# Patient Record
Sex: Female | Born: 1995 | Race: Black or African American | Hispanic: No | Marital: Single | Smoking: Never smoker
Health system: Southern US, Community
[De-identification: ages and names within clinical notes are randomized; demographics above are authoritative.]

## PROBLEM LIST (undated history)

## (undated) DIAGNOSIS — J45909 Unspecified asthma, uncomplicated: Secondary | ICD-10-CM

---
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
## 2017-04-25 DIAGNOSIS — O09299 Supervision of pregnancy with other poor reproductive or obstetric history, unspecified trimester: Secondary | ICD-10-CM | POA: Insufficient documentation

## 2018-04-12 DIAGNOSIS — Z98891 History of uterine scar from previous surgery: Secondary | ICD-10-CM | POA: Insufficient documentation

## 2019-01-31 DIAGNOSIS — R87610 Atypical squamous cells of undetermined significance on cytologic smear of cervix (ASC-US): Secondary | ICD-10-CM | POA: Insufficient documentation

## 2020-01-28 ENCOUNTER — Ambulatory Visit: Payer: Self-pay

## 2020-02-16 ENCOUNTER — Ambulatory Visit
Admission: EM | Admit: 2020-02-16 | Discharge: 2020-02-16 | Disposition: A | Discharge status: Home / Self Care | Payer: Medicaid Other | Attending: Emergency Medicine | Admitting: Emergency Medicine

## 2020-02-16 ENCOUNTER — Other Ambulatory Visit: Payer: Self-pay

## 2020-02-16 DIAGNOSIS — R112 Nausea with vomiting, unspecified: Secondary | ICD-10-CM

## 2020-02-16 DIAGNOSIS — R1013 Epigastric pain: Secondary | ICD-10-CM | POA: Insufficient documentation

## 2020-02-16 DIAGNOSIS — K5900 Constipation, unspecified: Secondary | ICD-10-CM | POA: Diagnosis not present

## 2020-02-16 DIAGNOSIS — Z3490 Encounter for supervision of normal pregnancy, unspecified, unspecified trimester: Secondary | ICD-10-CM | POA: Insufficient documentation

## 2020-02-16 LAB — POCT URINALYSIS DIP (MANUAL ENTRY)
Blood, UA: NEGATIVE
Glucose, UA: NEGATIVE mg/dL
Leukocytes, UA: NEGATIVE
Nitrite, UA: NEGATIVE
Protein Ur, POC: 30 mg/dL — AB
Spec Grav, UA: >=1.03 — AB (ref 1.010–1.025)
Urobilinogen, UA: 0.2 E.U./dL
pH, UA: 6.5 (ref 5.0–8.0)

## 2020-02-16 LAB — POCT URINE PREGNANCY: Preg Test, Ur: POSITIVE — AB

## 2020-02-16 NOTE — ED Provider Notes (Signed)
Patricia Shepard    CSN: [id number] Arrival date & time: 02/16/20  0909      History   Chief Complaint Chief Complaint  Patient presents with  . Constipation    HPI Patricia Shepard is a 24 y.o. female.   Patient presents with 3-day history of abdominal cramping, constipation, nausea, vomiting.  No emesis today; one episode of emesis yesterday.  Patient had a bowel movement yesterday but felt like it was incomplete.  She reports decreased appetite.  She states she had vaginal discharge last week but not now.  She denies fever, chills, dysuria, back pain, pelvic pain, or other symptoms.  Treatment attempted at home with MiraLAX and prune juice.  The history is provided by the patient.    Past Medical History:  Diagnosis Date  . Asthma     There are no problems to display for this patient.   Past Surgical History:  Procedure Laterality Date  . CESAREAN SECTION      OB History   No obstetric history on file.      Home Medications    Prior to Admission medications   Medication Sig Start Date End Date Taking? Authorizing Provider  albuterol (VENTOLIN HFA) 108 (90 Base) MCG/ACT inhaler Inhale into the lungs. 07/16/18  Yes [provider]    Family History History reviewed. No pertinent family history.  Social History Social History   Tobacco Use  . Smoking status: Never Smoker  . Smokeless tobacco: Never Used  Substance Use Topics  . Alcohol use: Never  . Drug use: Never     Allergies   Patient has no allergy information on record.   Review of Systems Review of Systems  Constitutional: Negative for chills and fever.  HENT: Negative for ear pain and sore throat.   Eyes: Negative for pain and visual disturbance.  Respiratory: Negative for cough and shortness of breath.   Cardiovascular: Negative for chest pain and palpitations.  Gastrointestinal: Positive for abdominal pain, constipation, nausea and vomiting. Negative for diarrhea.    Genitourinary: Negative for dysuria and hematuria.  Musculoskeletal: Negative for arthralgias and back pain.  Skin: Negative for color change and rash.  Neurological: Negative for seizures and syncope.  All other systems reviewed and are negative.    Physical Exam Triage Vital Signs ED Triage Vitals  Enc Vitals Group     BP      Pulse      Resp      Temp      Temp src      SpO2      Weight      Height      Head Circumference      Peak Flow      Pain Score      Pain Loc      Pain Edu?      Excl. in GC?    No data found.  Updated Vital Signs BP 100/61   Pulse 90   Temp 98.4 F (36.9 C) (Oral)   Resp 16   LMP  (LMP Unknown)   SpO2 98%   Visual Acuity Right Eye Distance:   Left Eye Distance:   Bilateral Distance:    Right Eye Near:   Left Eye Near:    Bilateral Near:     Physical Exam Vitals and nursing note reviewed.  Constitutional:      General: She is not in acute distress.    Appearance: She is well-developed. She is not ill-appearing.  HENT:     Head: Normocephalic and atraumatic.     Mouth/Throat:     Mouth: Mucous membranes are moist.  Eyes:     Conjunctiva/sclera: Conjunctivae normal.  Cardiovascular:     Rate and Rhythm: Normal rate and regular rhythm.     Heart sounds: No murmur heard.   Pulmonary:     Effort: Pulmonary effort is normal. No respiratory distress.     Breath sounds: Normal breath sounds.  Abdominal:     General: Bowel sounds are normal.     Palpations: Abdomen is soft.     Tenderness: There is abdominal tenderness in the epigastric area. There is no right CVA tenderness, left CVA tenderness, guarding or rebound.  Musculoskeletal:     Cervical back: Neck supple.  Skin:    General: Skin is warm and dry.     Findings: No rash.  Neurological:     General: No focal deficit present.     Mental Status: She is alert and oriented to person, place, and time.     Gait: Gait normal.  Psychiatric:        Mood and Affect: Mood  normal.        Behavior: Behavior normal.      UC Treatments / Results  Labs (all labs ordered are listed, but only abnormal results are displayed) Labs Reviewed  POCT URINALYSIS DIP (MANUAL ENTRY) - Abnormal; Notable for the following components:      Result Value   Clarity, UA hazy (*)    Bilirubin, UA small (*)    Ketones, POC UA moderate (40) (*)    Spec Grav, UA >=1.030 (*)    Protein Ur, POC =30 (*)    All other components within normal limits  POCT URINE PREGNANCY - Abnormal; Notable for the following components:   Preg Test, Ur Positive (*)    All other components within normal limits  CERVICOVAGINAL ANCILLARY ONLY    EKG   Radiology No results found.  Procedures Procedures (including critical care time)  Medications Ordered in UC Medications - No data to display  Initial Impression / Assessment and Plan / UC Course  I have reviewed the triage vital signs and the nursing notes.  Pertinent labs & imaging results that were available during my care of the patient were reviewed by me and considered in my medical decision making (see chart for details).   Pregnant, Epigastric pain, nausea with vomiting, constipation.  Urine pregnancy positive.  Instructed patient to go to the [facility] for evaluation of her abdominal pain in the setting of newly diagnosed pregnancy.  Patient agrees to plan of care.   Final Clinical Impressions(s) / UC Diagnoses   Final diagnoses:  Pregnancy, unspecified gestational age  Epigastric pain  Non-intractable vomiting with nausea, unspecified vomiting type  Constipation, unspecified constipation type     Discharge Instructions     Go to [facility] hospital for evaluation of your abdominal pain and pregnancy.        ED Prescriptions    None     PDMP not reviewed this encounter.   Mickie Bail, NP 02/16/20 1017

## 2020-02-16 NOTE — ED Triage Notes (Signed)
Pt reports feeling constipated x3 days. sts last BM was yesterday but it was only a "little bit". sts she has taken miralax, laxative and drank some prune juice.  sts she has had 3 episodes of emesis within the last 3 days. sts she is unable to eat.

## 2020-02-16 NOTE — Discharge Instructions (Addendum)
Go to [facility] hospital for evaluation of your abdominal pain and pregnancy.

## 2020-02-17 ENCOUNTER — Encounter (HOSPITAL_COMMUNITY): Payer: Self-pay | Admitting: Obstetrics & Gynecology

## 2020-02-17 ENCOUNTER — Other Ambulatory Visit: Payer: Self-pay

## 2020-02-17 ENCOUNTER — Telehealth (HOSPITAL_COMMUNITY): Payer: Self-pay | Admitting: Emergency Medicine

## 2020-02-17 ENCOUNTER — Inpatient Hospital Stay (HOSPITAL_COMMUNITY)
Admission: AD | Admit: 2020-02-17 | Discharge: 2020-02-17 | Disposition: A | Discharge status: Home / Self Care | Payer: Medicaid Other | Attending: Obstetrics & Gynecology | Admitting: Obstetrics & Gynecology

## 2020-02-17 ENCOUNTER — Inpatient Hospital Stay (HOSPITAL_COMMUNITY): Payer: Medicaid Other

## 2020-02-17 DIAGNOSIS — Z3A12 12 weeks gestation of pregnancy: Secondary | ICD-10-CM | POA: Insufficient documentation

## 2020-02-17 DIAGNOSIS — N760 Acute vaginitis: Secondary | ICD-10-CM

## 2020-02-17 DIAGNOSIS — R109 Unspecified abdominal pain: Secondary | ICD-10-CM | POA: Insufficient documentation

## 2020-02-17 DIAGNOSIS — Z3491 Encounter for supervision of normal pregnancy, unspecified, first trimester: Secondary | ICD-10-CM

## 2020-02-17 DIAGNOSIS — O26891 Other specified pregnancy related conditions, first trimester: Secondary | ICD-10-CM | POA: Diagnosis not present

## 2020-02-17 LAB — CERVICOVAGINAL ANCILLARY ONLY
Bacterial Vaginitis (gardnerella): POSITIVE — AB
Candida Glabrata: NEGATIVE
Candida Vaginitis: NEGATIVE
Chlamydia: NEGATIVE
Comment: NEGATIVE
Comment: NEGATIVE
Comment: NEGATIVE
Comment: NEGATIVE
Comment: NEGATIVE
Comment: NORMAL
Neisseria Gonorrhea: NEGATIVE
Trichomonas: NEGATIVE

## 2020-02-17 LAB — CBC
HCT: 42.8 % (ref 36.0–46.0)
Hemoglobin: 13.9 g/dL (ref 12.0–15.0)
MCH: 27.7 pg (ref 26.0–34.0)
MCHC: 32.5 g/dL (ref 30.0–36.0)
MCV: 85.3 fL (ref 80.0–100.0)
Platelets: 461 10*3/uL — ABNORMAL HIGH (ref 150–400)
RBC: 5.02 MIL/uL (ref 3.87–5.11)
RDW: 12.7 % (ref 11.5–15.5)
WBC: 7.8 10*3/uL (ref 4.0–10.5)
nRBC: 0 % (ref 0.0–0.2)

## 2020-02-17 LAB — HIV ANTIBODY (ROUTINE TESTING W REFLEX): HIV Screen 4th Generation wRfx: NONREACTIVE

## 2020-02-17 LAB — ABO/RH: ABO/RH(D): A POS

## 2020-02-17 LAB — HCG, QUANTITATIVE, PREGNANCY: hCG, Beta Chain, Quant, S: 59611 m[IU]/mL — ABNORMAL HIGH (ref <5)

## 2020-02-17 MED ORDER — METRONIDAZOLE 0.75 % VA GEL: 1.0000 | Freq: Every day | VAGINAL | 0 refills | Status: AC

## 2020-02-17 NOTE — MAU Note (Signed)
Having a little lower abd pain.  Started before she found out she was preg.  Cramping, feels like a lot of gas. No bleeding.  They were concerned at urgent care.

## 2020-02-17 NOTE — Discharge Instructions (Signed)
Center for [facility] Prenatal Care Providers          Center for [facility] locations:  Hours may vary. All offices accept Medicaid.  Please call for an appointment.  Center for [facility] @ [facility] (Will make appointments with insurance pending or no insurance)  [address], [place] [phone]  Center for [employer] @ [facility](Will make appointments with insurance pending or no insurance)  [address], [place] [phone]  Center for [facility] @ [facility]   [address], [place]  [phone]  Center For [facility] @ [facility]       [address], [place], [place] [phone]            Center for [facility] @ [place]     [address], [place], [place] [phone]          Center for [facility] @ [facility]   [address], [place], [place] [phone]     Center for [facility] @ [facility]  [address] , [place], [place]  [phone]   Safe Medications in Pregnancy   Acne: Benzoyl Peroxide Salicylic Acid  Backache/Headache: Tylenol: 2 regular strength every 4 hours OR              2 Extra strength every 6 hours  Colds/Coughs/Allergies: Benadryl (alcohol free) 25 mg every 6 hours as needed Breath right strips Claritin Cepacol throat lozenges Chloraseptic throat spray Cold-Eeze- up to three times per day Cough drops, alcohol free Flonase (by prescription only) Guaifenesin Mucinex Robitussin DM (plain only, alcohol free) Saline nasal spray/drops Sudafed (pseudoephedrine) & Actifed ** use only after [redacted] weeks gestation and if you do not have high blood pressure Tylenol Vicks Vaporub Zinc lozenges Zyrtec   Constipation: Colace Ducolax suppositories Fleet enema Glycerin suppositories Metamucil Milk of magnesia Miralax Senokot Smooth move  tea  Diarrhea: Kaopectate Imodium A-D  *NO pepto Bismol  Hemorrhoids: Anusol Anusol HC Preparation H Tucks  Indigestion: Tums Maalox Mylanta Zantac  Pepcid  Insomnia: Benadryl (alcohol free) 25mg  every 6 hours as needed Tylenol PM Unisom, no Gelcaps  Leg Cramps: Tums MagGel  Nausea/Vomiting:  Bonine Dramamine Emetrol Ginger extract Sea bands Meclizine  Nausea medication to take during pregnancy:  Unisom (doxylamine succinate 25 mg tablets) Take one tablet daily at bedtime. If symptoms are not adequately controlled, the dose can be increased to a maximum recommended dose of two tablets daily (1/2 tablet in the morning, 1/2 tablet mid-afternoon and one at bedtime). Vitamin B6 100mg  tablets. Take one tablet twice a day (up to 200 mg per day).  Skin Rashes: Aveeno products Benadryl cream or 25mg  every 6 hours as needed Calamine Lotion 1% cortisone cream  Yeast infection: Gyne-lotrimin 7 Monistat 7   **If taking multiple medications, please check labels to avoid duplicating the same active ingredients **take medication as directed on the label ** Do not exceed 4000 mg of tylenol in 24 hours **Do not take medications that contain aspirin or ibuprofen

## 2020-02-17 NOTE — MAU Provider Note (Signed)
Chief Complaint: Abdominal Pain   None     SUBJECTIVE HPI: Patricia Shepard is a 24 y.o. [id number] at 12 weeks by unsure LMP who presents to maternity admissions reporting cramping abdominal pain x 1 month, before she found out she was pregnant.  She reports her abdominal pain feels like gas. She is not sure what she can take for this.  She reports some hx of constipation, but she is having regular bowel movements now. There are no other signs/symptoms. She was prescribed Metrogel by Urgent care this week for BV but has not started the medication.    Location: lower abdomen Quality: cramping Severity: 5/10 on pain scale Duration: 1 month Timing: intermittent Modifying factors: none Associated signs and symptoms: none  HPI  Past Medical History:  Diagnosis Date   Asthma    Past Surgical History:  Procedure Laterality Date   CESAREAN SECTION     Social History   Socioeconomic History   Marital status: Single    Spouse name: Not on file   Number of children: Not on file   Years of education: Not on file   Highest education level: Not on file  Occupational History   Not on file  Tobacco Use   Smoking status: Never Smoker   Smokeless tobacco: Never Used  Substance and Sexual Activity   Alcohol use: Never   Drug use: Never   Sexual activity: Yes    Birth control/protection: Pill    Comment: "my body wasn't coping with the pills.  It was really heavy bleeding."  Other Topics Concern   Not on file  Social History Narrative   Not on file   Social Determinants of Health   Financial Resource Strain:    Difficulty of Paying Living Expenses: Not on file  Food Insecurity:    Worried About Running Out of Food in the Last Year: Not on file   Ran Out of Food in the Last Year: Not on file  Transportation Needs:    Lack of Transportation (Medical): Not on file   Lack of Transportation (Non-Medical): Not on file  Physical Activity:    Days of Exercise per  Week: Not on file   Minutes of Exercise per Session: Not on file  Stress:    Feeling of Stress : Not on file  Social Connections:    Frequency of Communication with Friends and Family: Not on file   Frequency of Social Gatherings with Friends and Family: Not on file   Attends Religious Services: Not on file   Active Member of Clubs or Organizations: Not on file   Attends Banker Meetings: Not on file   Marital Status: Not on file  Intimate Partner Violence:    Fear of Current or Ex-Partner: Not on file   Emotionally Abused: Not on file   Physically Abused: Not on file   Sexually Abused: Not on file   No current facility-administered medications on file prior to encounter.   Current Outpatient Medications on File Prior to Encounter  Medication Sig Dispense Refill   albuterol (VENTOLIN HFA) 108 (90 Base) MCG/ACT inhaler Inhale into the lungs.     metroNIDAZOLE (METROGEL VAGINAL) 0.75 % vaginal gel Place 1 Applicatorful vaginally at bedtime for 5 days. 50 g 0   No Known Allergies  ROS:  Review of Systems  Constitutional: Negative for chills, fatigue and fever.  Respiratory: Negative for shortness of breath.   Cardiovascular: Negative for chest pain.  Gastrointestinal: Positive for abdominal pain.  Negative for nausea and vomiting.  Genitourinary: Positive for pelvic pain. Negative for difficulty urinating, dysuria, flank pain, vaginal bleeding, vaginal discharge and vaginal pain.  Musculoskeletal: Negative for back pain.  Neurological: Negative for dizziness and headaches.  Psychiatric/Behavioral: Negative.      I have reviewed patient's Past Medical Hx, Surgical Hx, Family Hx, Social Hx, medications and allergies.   Physical Exam   Patient Vitals for the past 24 hrs:  BP Temp Temp src Pulse Resp SpO2 Height Weight  02/17/20 1620 111/66 98.3 F (36.8 C) Oral 80 17 99 % 5' (1.524 m) 72.5 kg   Constitutional: Well-developed, well-nourished female  in no acute distress.  Cardiovascular: normal rate Respiratory: normal effort GI: Abd soft, non-tender. Pos BS x 4 MS: Extremities nontender, no edema, normal ROM Neurologic: Alert and oriented x 4.  GU: Neg CVAT.  PELVIC EXAM: Deferred   LAB RESULTS Results for orders placed or performed during the hospital encounter of 02/17/20 (from the past 24 hour(s))  CBC     Status: Abnormal   Collection Time: 02/17/20  5:06 PM  Result Value Ref Range   WBC 7.8 4.0 - 10.5 K/uL   RBC 5.02 3.87 - 5.11 MIL/uL   Hemoglobin 13.9 12.0 - 15.0 g/dL   HCT 40.8 36 - 46 %   MCV 85.3 80.0 - 100.0 fL   MCH 27.7 26.0 - 34.0 pg   MCHC 32.5 30.0 - 36.0 g/dL   RDW 14.4 81.8 - 56.3 %   Platelets 461 (H) 150 - 400 K/uL   nRBC 0.0 0.0 - 0.2 %  hCG, quantitative, pregnancy     Status: Abnormal   Collection Time: 02/17/20  5:06 PM  Result Value Ref Range   hCG, Beta Chain, Quant, S 59,611 (H) <5 mIU/mL  HIV Antibody (routine testing w rflx)     Status: None   Collection Time: 02/17/20  5:06 PM  Result Value Ref Range   HIV Screen 4th Generation wRfx Non Reactive Non Reactive  ABO/Rh     Status: None   Collection Time: 02/17/20  5:06 PM  Result Value Ref Range   ABO/RH(D) A POS    No rh immune globuloin      NOT A RH IMMUNE GLOBULIN CANDIDATE, PT RH POSITIVE Performed at [facility], [address], [place], [place] [address]     --/--/A POS (10/29 1706)  IMAGING US OB LESS THAN 14 WEEKS WITH OB TRANSVAGINAL  Result Date: 02/17/2020 CLINICAL DATA:  24 year old pregnant female with abdominal pain. LMP: 11/24/2019 corresponding to an estimated gestational age of [redacted] weeks, 1 day. EXAM: OBSTETRIC <14 WK [place] AND TRANSVAGINAL OB US TECHNIQUE: Both transabdominal and transvaginal ultrasound examinations were performed for complete evaluation of the gestation as well as the maternal uterus, adnexal regions, and pelvic cul-de-sac. Transvaginal technique was performed to assess early pregnancy.  COMPARISON:  None. FINDINGS: Intrauterine gestational sac: Single intrauterine gestational sac. Yolk sac:  Seen Embryo:  Present Cardiac Activity: Detected Heart Rate: 117 bpm CRL: 4 mm   6 w   1 d                  [place] EDC: 10/11/2020 Subchorionic hemorrhage:  None visualized. Maternal uterus/adnexae: The maternal ovaries are unremarkable. IMPRESSION: Single live intrauterine pregnancy with an estimated gestational age of [redacted] weeks, 1 day based on today's crown-rump length. Electronically Signed   By: Elgie Collard M.D.   On: 02/17/2020 19:20    MAU Management/MDM:  Orders Placed This Encounter  Procedures   Wet prep, genital   US OB LESS THAN 14 WEEKS WITH OB TRANSVAGINAL   CBC   hCG, quantitative, pregnancy   HIV Antibody (routine testing w rflx)   ABO/Rh   Discharge patient    No orders of the defined types were placed in this encounter.   Pt with IUP on today's [place]. Pain x 4-5 weeks most c/w digestive pain.  Pt also positive for BV at recent Urgent Care visit which may contribute to cramping.  Complete course of metrogel as prescribed.  Discussed dietary changes for constipation including increased PO fluids and fiber.  Pt to start prenatal care at [facility], information given. Pt discharged with strict return precautions.  ASSESSMENT 1. Normal IUP (intrauterine pregnancy) on prenatal ultrasound, first trimester   2. Abdominal pain during pregnancy in first trimester     PLAN Discharge home Allergies as of 02/17/2020   No Known Allergies     Medication List    TAKE these medications   albuterol 108 (90 Base) MCG/ACT inhaler Commonly known as: VENTOLIN HFA Inhale into the lungs.   metroNIDAZOLE 0.75 % vaginal gel Commonly known as: METROGEL VAGINAL Place 1 Applicatorful vaginally at bedtime for 5 days.       Follow-up Information    Center for [facility] at [facility] Follow up.   Specialty: Obstetrics and Gynecology Why: Call to schedule your new  OB appointment. Return to MAU as needed for emergencies. Contact information: [address] [place] [address] [phone]              Sharen Counter Certified Nurse-Midwife 02/17/2020  8:17 PM

## 2020-02-24 ENCOUNTER — Ambulatory Visit
Admission: EM | Admit: 2020-02-24 | Discharge: 2020-02-24 | Discharge status: Against Medical Advice | Payer: Medicaid Other

## 2020-02-27 ENCOUNTER — Encounter (HOSPITAL_COMMUNITY): Payer: Self-pay | Admitting: Obstetrics and Gynecology

## 2020-02-27 ENCOUNTER — Inpatient Hospital Stay (HOSPITAL_COMMUNITY)
Admission: AD | Admit: 2020-02-27 | Discharge: 2020-02-27 | Disposition: A | Discharge status: Home / Self Care | Payer: Medicaid Other | Attending: Family Medicine | Admitting: Family Medicine

## 2020-02-27 ENCOUNTER — Other Ambulatory Visit: Payer: Self-pay

## 2020-02-27 DIAGNOSIS — O209 Hemorrhage in early pregnancy, unspecified: Secondary | ICD-10-CM

## 2020-02-27 DIAGNOSIS — K5900 Constipation, unspecified: Secondary | ICD-10-CM | POA: Diagnosis not present

## 2020-02-27 DIAGNOSIS — Z3A01 Less than 8 weeks gestation of pregnancy: Secondary | ICD-10-CM

## 2020-02-27 DIAGNOSIS — O99611 Diseases of the digestive system complicating pregnancy, first trimester: Secondary | ICD-10-CM | POA: Diagnosis present

## 2020-02-27 LAB — URINALYSIS, ROUTINE W REFLEX MICROSCOPIC
Bilirubin Urine: NEGATIVE
Glucose, UA: NEGATIVE mg/dL
Hgb urine dipstick: NEGATIVE
Ketones, ur: 80 mg/dL — AB
Leukocytes,Ua: NEGATIVE
Nitrite: NEGATIVE
Protein, ur: 30 mg/dL — AB
Specific Gravity, Urine: 1.033 — ABNORMAL HIGH (ref 1.005–1.030)
pH: 5 (ref 5.0–8.0)

## 2020-02-27 MED ORDER — DOCUSATE SODIUM 100 MG PO CAPS: 100.0000 mg | ORAL_CAPSULE | Freq: Two times a day (BID) | ORAL | 0 refills | Status: DC

## 2020-02-27 NOTE — MAU Provider Note (Signed)
History     CSN: [id number]  Arrival date and time: 02/27/20 1430   First Provider Initiated Contact with Patient 02/27/20 1549      Chief Complaint  Patient presents with   Vaginal Discharge   Constipation   HPI  Patient is a 23 year old [id number] at [redacted]w[redacted]d who presents for abdominal pain and vaginal bleeding. Patient was treated for BV on 02/17/2020 at urgent care. Patient reports the abdominal pain has been present for about three weeks, and the vaginal bleeding started three days prior to arrival. Patient states the pain is in the LLQ and RLQ. Patient reports she has not had a bowel movement in about 3 weeks. She has tried taking laxatives but has not helped. Patient endorses dizziness but denies nausea, vomiting, or fever.  OB History    Gravida  4   Para  2   Term  2   Preterm  0   AB  1   Living  2     SAB  0   TAB  1   Ectopic  0   Multiple  0   Live Births  2           Past Medical History:  Diagnosis Date   Asthma     Past Surgical History:  Procedure Laterality Date   CESAREAN SECTION      History reviewed. No pertinent family history.  Social History   Tobacco Use   Smoking status: Never Smoker   Smokeless tobacco: Never Used  Substance Use Topics   Alcohol use: Never   Drug use: Never    Allergies: No Known Allergies  No medications prior to admission.    Review of Systems  Constitutional: Negative for chills and fever.  Respiratory: Negative for shortness of breath.   Cardiovascular: Negative for chest pain.  Gastrointestinal: Positive for abdominal pain and constipation. Negative for blood in stool, diarrhea, nausea and vomiting.  Genitourinary: Positive for pelvic pain, vaginal bleeding and vaginal discharge. Negative for difficulty urinating and dysuria.  Neurological: Positive for dizziness. Negative for headaches.   Physical Exam   Blood pressure 98/65, pulse (!) 58, temperature 98.3 F (36.8 C), temperature  source Oral, resp. rate 18, weight 70.6 kg, last menstrual period 11/24/2019.  Physical Exam Exam conducted with a chaperone present.  Constitutional:      Appearance: Normal appearance.  HENT:     Head: Normocephalic and atraumatic.  Pulmonary:     Effort: Pulmonary effort is normal.     Breath sounds: Normal breath sounds.  Abdominal:     General: Abdomen is flat. There is no distension.     Palpations: Abdomen is soft. There is no mass.     Tenderness: There is abdominal tenderness (RLQ and LLQ TTP). There is no guarding or rebound.  Genitourinary:    General: Normal vulva.     Vagina: Vaginal discharge (White discharge with brown colored blood) present.     Cervix: No erythema or cervical bleeding.     Uterus: Not tender.   Musculoskeletal:     Cervical back: Normal range of motion and neck supple.  Skin:    General: Skin is warm and dry.     Findings: No rash.  Neurological:     Mental Status: She is alert and oriented to person, place, and time.  Psychiatric:        Mood and Affect: Mood normal.        Behavior: Behavior normal.  MAU Course  Procedures Results for orders placed or performed during the hospital encounter of 02/27/20 (from the past 24 hour(s))  Urinalysis, Routine w reflex microscopic Urine, Clean Catch     Status: Abnormal   Collection Time: 02/27/20  3:23 PM  Result Value Ref Range   Color, Urine AMBER (A) YELLOW   APPearance HAZY (A) CLEAR   Specific Gravity, Urine 1.033 (H) 1.005 - 1.030   pH 5.0 5.0 - 8.0   Glucose, UA NEGATIVE NEGATIVE mg/dL   Hgb urine dipstick NEGATIVE NEGATIVE   Bilirubin Urine NEGATIVE NEGATIVE   Ketones, ur 80 (A) NEGATIVE mg/dL   Protein, ur 30 (A) NEGATIVE mg/dL   Nitrite NEGATIVE NEGATIVE   Leukocytes,Ua NEGATIVE NEGATIVE   RBC / HPF 0-5 0 - 5 RBC/hpf   WBC, UA 0-5 0 - 5 WBC/hpf   Bacteria, UA RARE (A) NONE SEEN   Squamous Epithelial / LPF 0-5 0 - 5   Mucus PRESENT    Ultrasound: showed IUP with fetal  heartbeat observed.  MDM UA and bedside US completed.  US showed IUP and fetal heartbeat observed. UA shows specific gravity of 1.033, ketones 80, protein 30, rare bacteria.  Assessment and Plan  1. Constipation 2. Intrauterine pregnancy 3. Abdominal pain with early vaginal bleeding in first trimester.  Abdominal pain secondary to constipation. Patient given soap suds enema for constipation and discharged with Colace. Patient encouraged to increase water intake to prevent dehydration and constipation. Discharge observed on cervical exam most likely blood from previous BV infection. Establish outpatient care for intrauterine pregnancy.  Gigi Gin 02/27/2020, 5:22 PM

## 2020-02-27 NOTE — Discharge Instructions (Signed)
You have constipation which is hard stools that are difficult to pass. It is important to have regular bowel movements every 1-3 days that are soft and easy to pass. Hard stools increase your risk of hemorrhoids and are very uncomfortable.   To prevent constipation you can increase the amount of fiber in your diet. Examples of foods with fiber are leafy greens, whole grain breads, oatmeal and other grains.  It is also important to drink at least 64 ounces of water everyday.   If you have not had a bowel movement in 4-5 days, you made need to clean out your bowel.  This will help you establish normal movements through your bowel.    Miralax Clean out  Take 8 capfuls of miralax in 64 oz of gatorade. You can use any fluid that appeals to you (gatorade, water, juice)  Continue to drink at least 64 ounces of water throughout the day  You can repeat with another 8 capfuls of miralax in 64 oz of gatorade if you are not having a large amount of stools  You will need to be at home and close to a bathroom for about 8 hours when you do the above as you may need to go to the bathroom frequently.   After you are cleaned out: - Start Colace100mg  twice daily - Start Miralax once daily - Start a daily fiber supplement like metamucil or citrucel - You can safely use enemas in pregnancy  - if you are having diarrhea you can reduce to Colace once a day or miralax every other day or a 1/2 capful daily.       Constipation, Adult Constipation is when a person has fewer bowel movements in a week than normal, has difficulty having a bowel movement, or has stools that are dry, hard, or larger than normal. Constipation may be caused by an underlying condition. It may become worse with age if a person takes certain medicines and does not take in enough fluids. Follow these instructions at home: Eating and drinking   Eat foods that have a lot of fiber, such as fresh fruits and vegetables, whole grains, and  beans.  Limit foods that are high in fat, low in fiber, or overly processed, such as french fries, hamburgers, cookies, candies, and soda.  Drink enough fluid to keep your urine clear or pale yellow. General instructions  Exercise regularly or as told by your health care provider.  Go to the restroom when you have the urge to go. Do not hold it in.  Take over-the-counter and prescription medicines only as told by your health care provider. These include any fiber supplements.  Practice pelvic floor retraining exercises, such as deep breathing while relaxing the lower abdomen and pelvic floor relaxation during bowel movements.  Watch your condition for any changes.  Keep all follow-up visits as told by your health care provider. This is important. Contact a health care provider if:  You have pain that gets worse.  You have a fever.  You do not have a bowel movement after 4 days.  You vomit.  You are not hungry.  You lose weight.  You are bleeding from the anus.  You have thin, pencil-like stools. Get help right away if:  You have a fever and your symptoms suddenly get worse.  You leak stool or have blood in your stool.  Your abdomen is bloated.  You have severe pain in your abdomen.  You feel dizzy or you faint. This  information is not intended to replace advice given to you by your health care provider. Make sure you discuss any questions you have with your health care provider. Document Revised: 03/20/2017 Document Reviewed: 09/26/2015 Elsevier Patient Education  2020 [employer].

## 2020-02-27 NOTE — MAU Provider Note (Signed)
History     CSN: [id number]  Arrival date and time: 02/27/20 1430   First Provider Initiated Contact with Patient 02/27/20 1549      Chief Complaint  Patient presents with  . Vaginal Discharge  . Constipation   HPI Patricia Shepard is a 24 y.o. [id number] at [redacted]w[redacted]d who presents for bloody vaginal discharge & constipation.  Was recently treated for BV. Finished medication yesterday. Since then has had vaginal discharge with blood in it. Can't tell what color it is. Denies itching or irritation.  Reports last BM was 3 weeks ago. Tried eating prunes & drinking apple juice without results. Has felt bloated & had abdominal cramping since being constipated. Denies n/v or fever. States she's not eating or drinking as much due to these feelings.  Goes to [facility] for prenatal care.   Location: abdomen Quality: full, cramping Severity: 5/10 on pain scale Duration: 3 weeks Timing: intermittent Modifying factors: none Associated signs and symptoms: vaginal discharge, constipation  OB History    Gravida  4   Para  2   Term  2   Preterm  0   AB  1   Living  2     SAB  0   TAB  1   Ectopic  0   Multiple  0   Live Births  2           Past Medical History:  Diagnosis Date  . Asthma     Past Surgical History:  Procedure Laterality Date  . CESAREAN SECTION      History reviewed. No pertinent family history.  Social History   Tobacco Use  . Smoking status: Never Smoker  . Smokeless tobacco: Never Used  Substance Use Topics  . Alcohol use: Never  . Drug use: Never    Allergies: No Known Allergies  Medications Prior to Admission  Medication Sig Dispense Refill Last Dose  . Prenatal Vit-Fe Fumarate-FA (PRENATAL VITAMIN PO) Take 1 tablet by mouth daily.     Marland Kitchen albuterol (VENTOLIN HFA) 108 (90 Base) MCG/ACT inhaler Inhale into the lungs.   More than a month at Unknown time    Review of Systems  Constitutional: Negative.   Gastrointestinal: Positive for  abdominal pain and constipation. Negative for blood in stool, diarrhea, nausea, rectal pain and vomiting.  Genitourinary: Positive for vaginal bleeding and vaginal discharge. Negative for dysuria, flank pain, frequency, hematuria and pelvic pain.  Musculoskeletal: Negative for back pain.   Physical Exam   Blood pressure 110/63, pulse 86, temperature 98.3 F (36.8 C), temperature source Oral, resp. rate 19, weight 70.6 kg, last menstrual period 11/24/2019.  Physical Exam Vitals and nursing note reviewed. Exam conducted with a chaperone present.  Constitutional:      General: She is not in acute distress.    Appearance: Normal appearance.  HENT:     Head: Normocephalic and atraumatic.  Pulmonary:     Effort: Pulmonary effort is normal. No respiratory distress.  Abdominal:     General: Abdomen is flat. There is no distension.     Tenderness: There is no abdominal tenderness.  Genitourinary:    General: Normal vulva.     Exam position: Lithotomy position.     Comments: Small amount of white discharge & brown tinged blood. Cervix pink/smooth. Cervix closed. No active bleeding or abnormal discharge.  Neurological:     Mental Status: She is alert.     MAU Course  Procedures Results for orders placed or performed during  the hospital encounter of 02/27/20 (from the past 24 hour(s))  Urinalysis, Routine w reflex microscopic Urine, Clean Catch     Status: Abnormal   Collection Time: 02/27/20  3:23 PM  Result Value Ref Range   Color, Urine AMBER (A) YELLOW   APPearance HAZY (A) CLEAR   Specific Gravity, Urine 1.033 (H) 1.005 - 1.030   pH 5.0 5.0 - 8.0   Glucose, UA NEGATIVE NEGATIVE mg/dL   Hgb urine dipstick NEGATIVE NEGATIVE   Bilirubin Urine NEGATIVE NEGATIVE   Ketones, ur 80 (A) NEGATIVE mg/dL   Protein, ur 30 (A) NEGATIVE mg/dL   Nitrite NEGATIVE NEGATIVE   Leukocytes,Ua NEGATIVE NEGATIVE   RBC / HPF 0-5 0 - 5 RBC/hpf   WBC, UA 0-5 0 - 5 WBC/hpf   Bacteria, UA RARE (A) NONE  SEEN   Squamous Epithelial / LPF 0-5 0 - 5   Mucus PRESENT     MDM Pt informed that the ultrasound is considered a limited OB ultrasound and is not intended to be a complete ultrasound exam.  Patient also informed that the ultrasound is not being completed with the intent of assessing for fetal or placental anomalies or any pelvic abnormalities.  Explained that the purpose of today's ultrasound is to assess for  viability.  Patient acknowledges the purpose of the exam and the limitations of the study.   Live IUP with FHR 167 bpm  Vaginal swabs collected less than 2 weeks ago. Spec exam performed. No active bleeding & cervix closed.   Patient with untreated constipation. Discussed outpatient management of symptoms. Patient interested in enema. Soap suds enema given with good response. Patient states she feels much better. Wants rx for stool softener. Also given information about diet & meds at home.   U/a with high gravity & ketones. Pt not eating or drinking due to symptoms. No n/v. Feels better after enema. IV fluids not indicated at this time. Patient can replenish at home.   Assessment and Plan   1. Constipation during pregnancy in first trimester   2. Vaginal bleeding in pregnancy, first trimester   3. [redacted] weeks gestation of pregnancy    -Rx colace -constipation info given -discussed laxatives & clean out as needed -reviewed reasons to return to MAU  Judeth Horn 02/27/2020, 4:23 PM

## 2020-02-27 NOTE — MAU Note (Signed)
Signature page went offline in patient's room.  Had pt sign printed AVS copy.

## 2020-02-27 NOTE — MAU Note (Signed)
Patient reports having some bloody discharge when she used the restroom on Friday.  Also reports some stomach cramps that occur intermittently.  Also reports she hasn't had a BM in 3 weeks.  Was last seen in the office last week.

## 2020-03-22 ENCOUNTER — Encounter (HOSPITAL_COMMUNITY): Payer: Self-pay | Admitting: *Deleted

## 2021-01-16 ENCOUNTER — Ambulatory Visit: Payer: Self-pay

## 2021-12-20 IMAGING — US US OB < 14 WEEKS - US OB TV
1 series · 15 of 28 positions shown · non-contrast
Comparison: None.

CLINICAL DATA: 24-year-old pregnant female with abdominal pain.
LMP: 11/24/2019 corresponding to an estimated gestational age of 12
weeks, 1 day.

EXAM:
OBSTETRIC <14 WK US AND TRANSVAGINAL OB US
TECHNIQUE: Both transabdominal and transvaginal ultrasound examinations were
performed for complete evaluation of the gestation as well as the
maternal uterus, adnexal regions, and pelvic cul-de-sac.
Transvaginal technique was performed to assess early pregnancy.

[Series 1: us ob < 14 weeks - us ob tv · 15 of 55 slices shown]
[im 1/55]
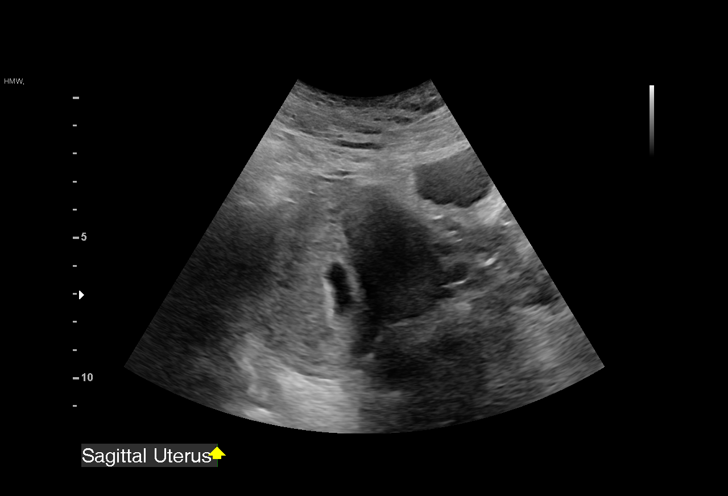
[im 5/55]
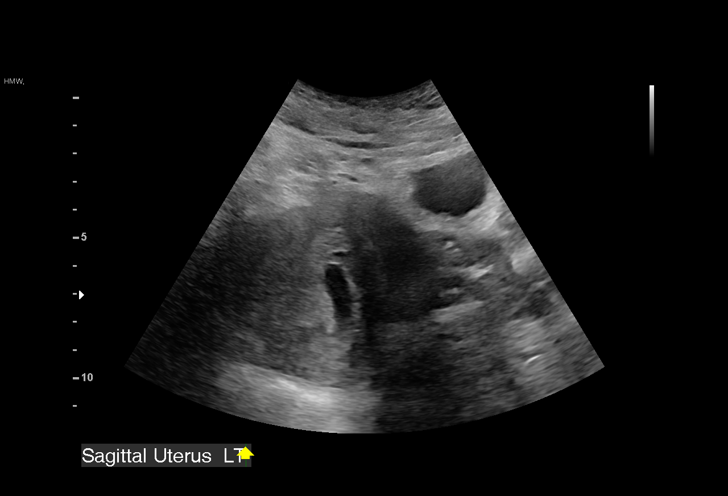
[im 9/55]
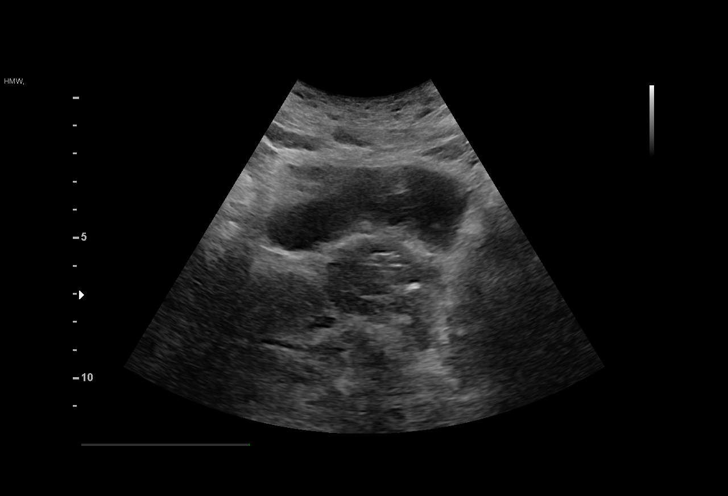
[im 13/55]
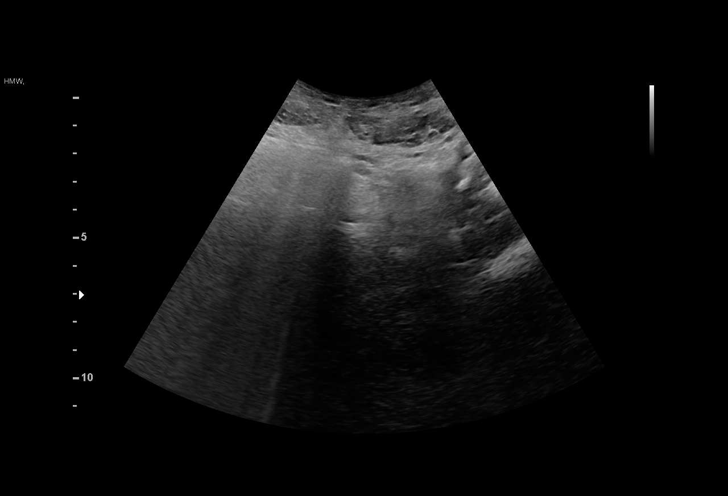
[im 17/55]
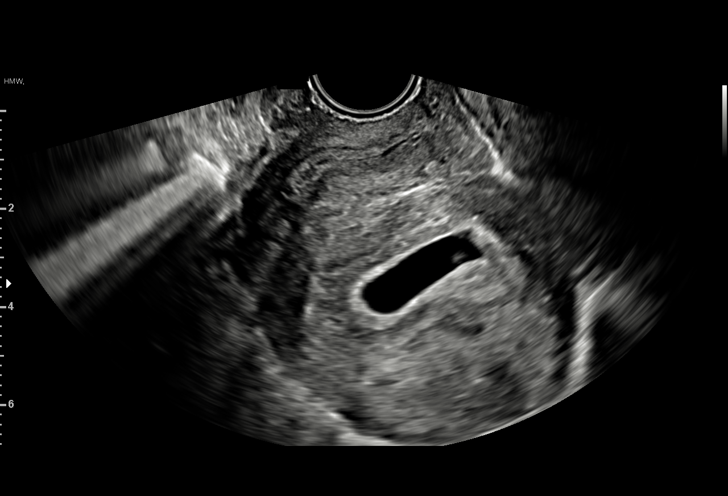
[im 21/55]
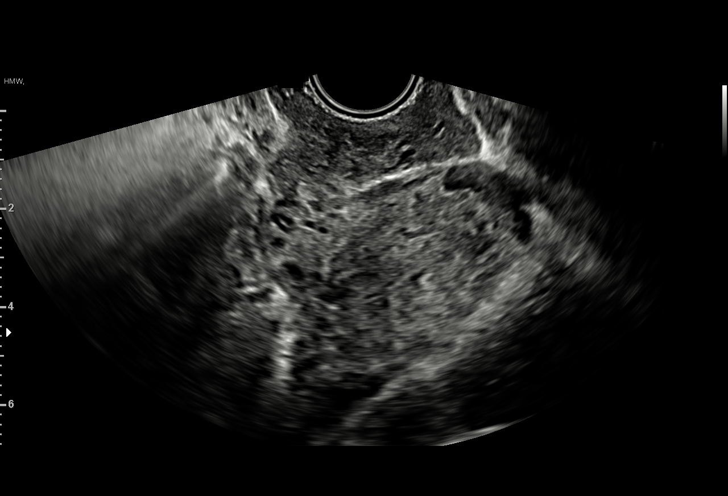
[im 25/55]
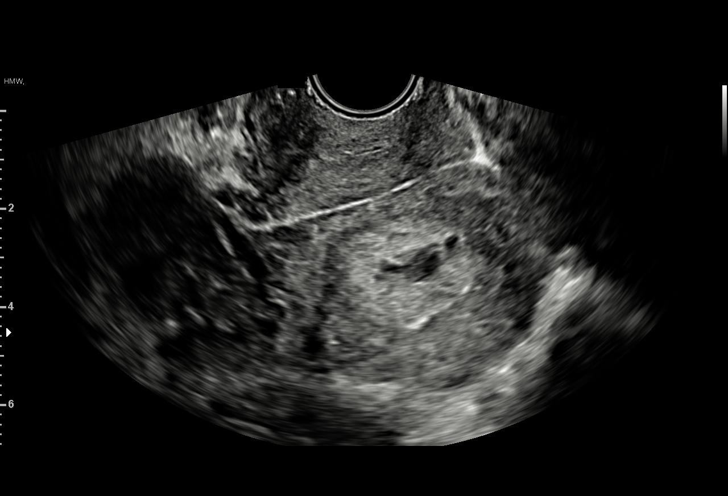
[im 29/55]
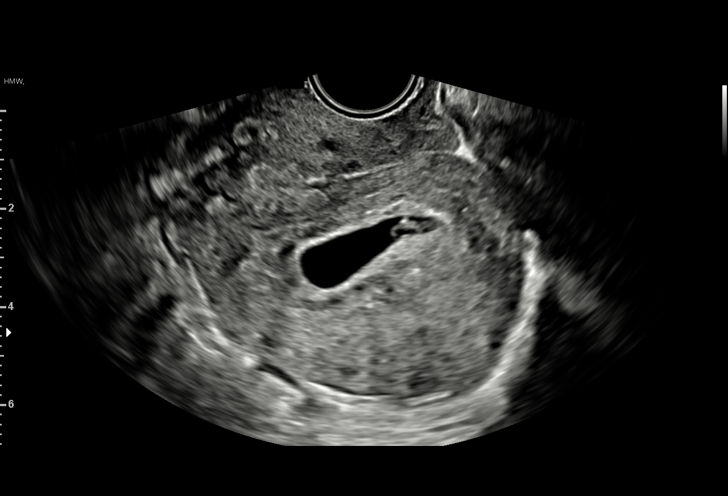
[im 31/55]
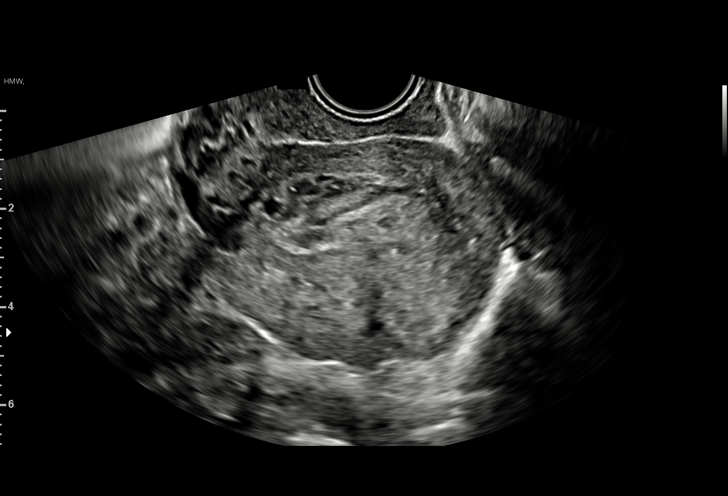
[im 35/55]
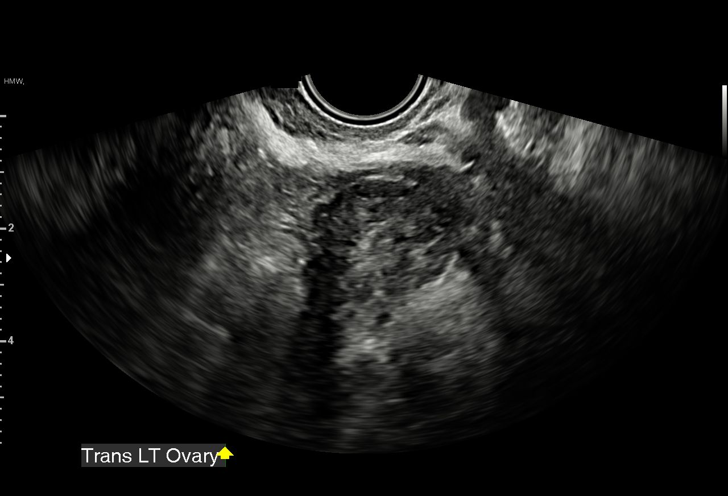
[im 39/55]
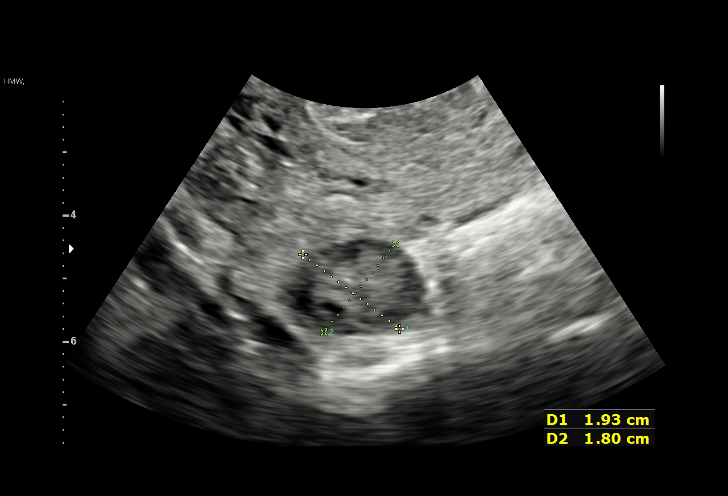
[im 43/55]
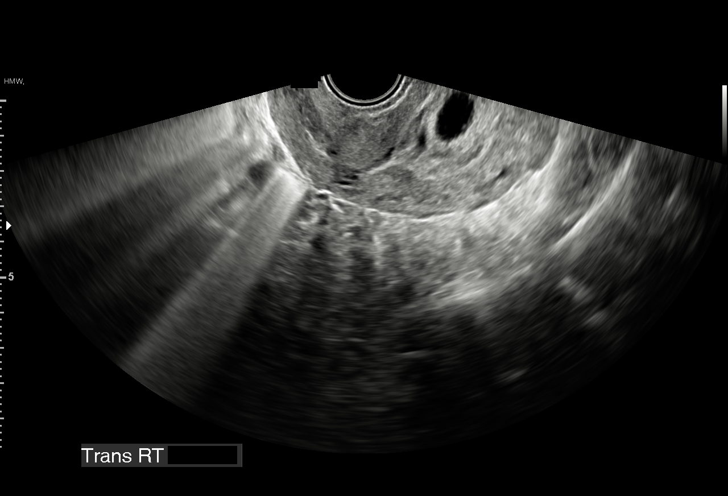
[im 47/55]
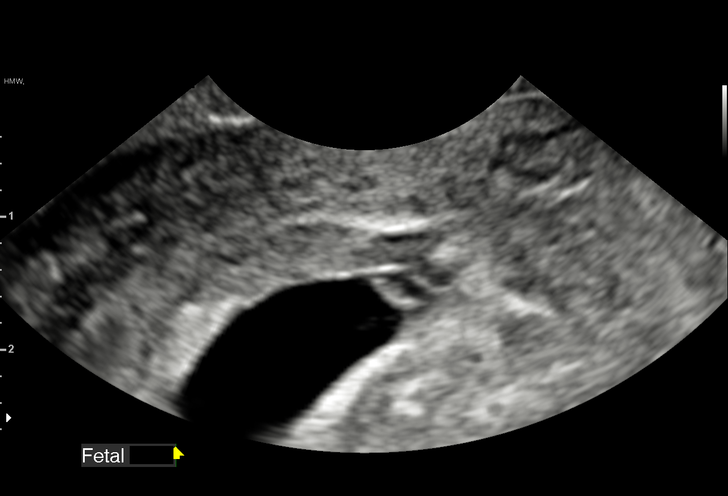
[im 51/55]
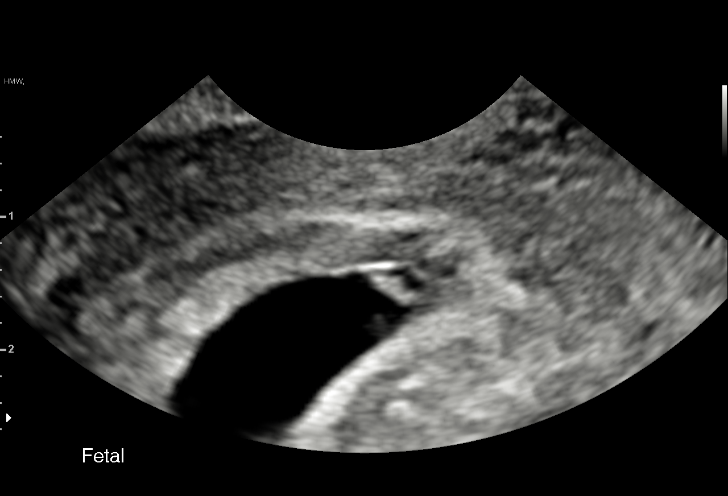
[im 55/55]
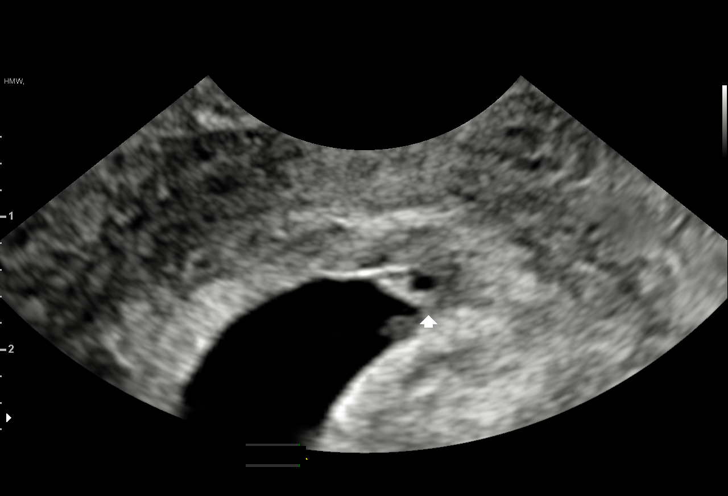

[15 of 28 positions shown; findings below may reference images not displayed]

FINDINGS: Intrauterine gestational sac: Single intrauterine gestational sac.

Yolk sac:  Seen

Embryo:  Present

Cardiac Activity: Detected

Heart Rate: 117 bpm

CRL: 4 mm   6 w   1 d                  US EDC: 10/11/2020

Subchorionic hemorrhage:  None visualized.

Maternal uterus/adnexae: The maternal ovaries are unremarkable.
IMPRESSION: Single live intrauterine pregnancy with an estimated gestational age
of 6 weeks, 1 day based on today's crown-rump length.

## 2022-01-02 ENCOUNTER — Ambulatory Visit
Admission: EM | Admit: 2022-01-02 | Discharge: 2022-01-02 | Disposition: A | Discharge status: Home / Self Care | Payer: Medicaid Other | Attending: Emergency Medicine | Admitting: Emergency Medicine

## 2022-01-02 DIAGNOSIS — N3090 Cystitis, unspecified without hematuria: Secondary | ICD-10-CM | POA: Diagnosis not present

## 2022-01-02 DIAGNOSIS — M545 Low back pain, unspecified: Secondary | ICD-10-CM | POA: Diagnosis not present

## 2022-01-02 DIAGNOSIS — Z113 Encounter for screening for infections with a predominantly sexual mode of transmission: Secondary | ICD-10-CM

## 2022-01-02 DIAGNOSIS — Y92410 Unspecified street and highway as the place of occurrence of the external cause: Secondary | ICD-10-CM | POA: Insufficient documentation

## 2022-01-02 DIAGNOSIS — Z202 Contact with and (suspected) exposure to infections with a predominantly sexual mode of transmission: Secondary | ICD-10-CM | POA: Insufficient documentation

## 2022-01-02 DIAGNOSIS — B999 Unspecified infectious disease: Secondary | ICD-10-CM | POA: Insufficient documentation

## 2022-01-02 LAB — POCT URINALYSIS DIP (MANUAL ENTRY)
Bilirubin, UA: NEGATIVE
Glucose, UA: NEGATIVE mg/dL
Ketones, POC UA: NEGATIVE mg/dL
Nitrite, UA: NEGATIVE
Protein Ur, POC: NEGATIVE mg/dL
Spec Grav, UA: >=1.03 — AB (ref 1.010–1.025)
Urobilinogen, UA: 0.2 E.U./dL
pH, UA: 5.5 (ref 5.0–8.0)

## 2022-01-02 LAB — POCT URINE PREGNANCY: Preg Test, Ur: NEGATIVE

## 2022-01-02 MED ORDER — DICLOFENAC SODIUM 1 % EX GEL: 4.0000 g | Freq: Four times a day (QID) | CUTANEOUS | 2 refills | Status: DC

## 2022-01-02 MED ORDER — IBUPROFEN 600 MG PO TABS: 600.0000 mg | ORAL_TABLET | Freq: Three times a day (TID) | ORAL | 0 refills | Status: DC | PRN

## 2022-01-02 MED ORDER — SULFAMETHOXAZOLE-TRIMETHOPRIM 800-160 MG PO TABS: 1.0000 | ORAL_TABLET | Freq: Two times a day (BID) | ORAL | 0 refills | Status: AC

## 2022-01-02 MED ORDER — BACLOFEN 10 MG PO TABS: 10.0000 mg | ORAL_TABLET | Freq: Every day | ORAL | 0 refills | Status: AC

## 2022-01-02 NOTE — Discharge Instructions (Addendum)
Common causes of urinary tract infections include but are not limited to holding your urine longer than you should, squatting instead of sitting down when urinating, sitting around in wet clothing such as a wet swimsuit or gym clothes too long, not emptying your bladder after having sexual intercourse, wiping from back to front instead of front to back after having a bowel movement.  Less common causes of urinary tract infections include but are not limited to anatomical shifts in the location of your bladder or uterus causing obstruction of passage of urine from your bladder to your urethra where your urine comes out or prolapse of your rectum into your vaginal wall.  These less common causes can be evaluated by gynecologist, a urologist or subspecialist called a uro-gynecologist   The urinalysis that we performed in the clinic today was abnormal.     You were advised to begin antibiotics today because your urinalysis is abnormal and you are having active symptoms of an acute lower urinary tract infection also known as cystitis.  It is very important that you take all doses exactly as prescribed.  Incomplete antibiotic therapy can cause worsening urinary tract infection that can become aggressive, escape from urinary tract into your bloodstream causing sepsis which will require hospitalization.   Please pick up and begin taking your prescription for Bactrim DS (trimethoprim sulfamethoxazole) as soon as possible.  Please take all doses exactly as prescribed.  You can take this medication with or without food.  This medication is safe to take with your other medications.   Please consider abstaining from sexual intercourse while you are being treated for urinary tract infection.  If you have not had complete relief of your symptoms after 3 days of antibiotics, please return for repeat evaluation.  The results of your vaginal swab test which screens for BV, yeast, gonorrhea, chlamydia and trichomonas will be  made available to you once it is complete.  This typically takes 3 to 5 days.  Please note that we do not test for herpes virus unless you are having an active lesion concerning for herpes outbreak.  Please abstain from sexual intercourse of any kind, vaginal, oral or anal, until you have received the results of your STD testing.  The results will initially be posted to your MyChart account and, if any of your results are abnormal, you will receive a phone call regarding treatment.  Prescriptions, if any are needed, will be provided for you at your pharmacy.     The mainstay of therapy for musculoskeletal pain is reduction of inflammation and relaxation of tension which is causing inflammation.  Keep in mind, pain always begets more pain.  To help you stay ahead of your pain and inflammation, I have provided the following regimen for you:   This evening, please begin taking baclofen 10 mg.  This is a highly effective muscle relaxer and antispasmodic which should continue to provide you with relaxation of your tense muscles, allow you to sleep well and to keep your pain under control. This evening, please begin taking ibuprofen 800 mg 3 times daily.  Please keep in mind that it is always easier to treat a little bit of pain that is to treat a lot of pain.  I recommend that for the next several days, you take this medication on a scheduled basis.  After that, take it when you begin to feel the pain returning, do not wait until you are in a lot of pain.   During  the day, please set aside time to apply ice to the affected area 4 times daily for 20 minutes each application.  This can be achieved by using a bag of frozen peas or corn, a Ziploc bag filled with ice and water, or Ziploc bag filled with half rubbing alcohol and half Dawn dish detergent, frozen into a slush.  Please be careful not to apply ice directly to your skin, always place a soft cloth between you and the ice pack.   You are welcome to use  topical anti-inflammatory creams such as Voltaren gel, capsaicin or Aspercreme as recommended.  These medications are available over-the-counter, please follow manufactures instructions for use.  As a courtesy, I provided you with a prescription for diclofenac in the event that your insurance will pay for this.   Please consider discussing referral to physical therapy with your primary care provider.  Physical therapist are very good at teasing out the underlying cause of acute lower back pain and helping with prevention of future recurrences.   Please avoid attempts to stretch or strengthen the affected area until you are feeling completely pain-free.  Attempts to do so will only prolong the healing process.   I also recommend that you remain out of work for the next several days, I provided you with a note to return to work in 3 days.  If you feel that you need this time extended, please follow-up with your primary care provider or return to urgent care for reevaluation so that we can provide you with a note for another 3 days.  Thank you for visiting urgent care today.  We appreciate the opportunity to participate in your care.
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
## 2022-01-06 ENCOUNTER — Telehealth (HOSPITAL_COMMUNITY): Payer: Self-pay | Admitting: Emergency Medicine

## 2022-01-06 LAB — CERVICOVAGINAL ANCILLARY ONLY
Bacterial Vaginitis (gardnerella): POSITIVE — AB
Candida Glabrata: NEGATIVE
Candida Vaginitis: NEGATIVE
Chlamydia: NEGATIVE
Comment: NEGATIVE
Comment: NEGATIVE
Comment: NEGATIVE
Comment: NEGATIVE
Comment: NEGATIVE
Comment: NORMAL
Neisseria Gonorrhea: NEGATIVE
Trichomonas: NEGATIVE

## 2022-01-06 MED ORDER — METRONIDAZOLE 0.75 % VA GEL: 1.0000 | Freq: Every day | VAGINAL | 0 refills | Status: AC

## 2022-01-07 ENCOUNTER — Ambulatory Visit: Payer: Self-pay

## 2022-01-07 ENCOUNTER — Ambulatory Visit
Admission: EM | Admit: 2022-01-07 | Discharge: 2022-01-07 | Discharge status: Absent without leave | Payer: Medicaid Other

## 2022-01-08 ENCOUNTER — Encounter: Payer: Self-pay | Admitting: Emergency Medicine

## 2022-01-08 ENCOUNTER — Ambulatory Visit: Payer: Medicaid Other

## 2022-01-08 ENCOUNTER — Ambulatory Visit
Admission: EM | Admit: 2022-01-08 | Discharge: 2022-01-08 | Disposition: A | Discharge status: Home / Self Care | Payer: Medicaid Other | Attending: Urgent Care | Admitting: Urgent Care

## 2022-01-08 ENCOUNTER — Ambulatory Visit (INDEPENDENT_AMBULATORY_CARE_PROVIDER_SITE_OTHER): Payer: Medicaid Other

## 2022-01-08 DIAGNOSIS — M5441 Lumbago with sciatica, right side: Secondary | ICD-10-CM

## 2022-01-08 DIAGNOSIS — M545 Low back pain, unspecified: Secondary | ICD-10-CM

## 2022-01-08 DIAGNOSIS — M5416 Radiculopathy, lumbar region: Secondary | ICD-10-CM | POA: Diagnosis not present

## 2022-01-08 MED ORDER — NAPROXEN 375 MG PO TABS: 375.0000 mg | ORAL_TABLET | Freq: Two times a day (BID) | ORAL | 0 refills | Status: DC
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
## 2022-02-21 ENCOUNTER — Ambulatory Visit
Admission: EM | Admit: 2022-02-21 | Discharge: 2022-02-21 | Disposition: A | Discharge status: Home / Self Care | Payer: Medicaid Other | Attending: Emergency Medicine | Admitting: Emergency Medicine

## 2022-02-21 DIAGNOSIS — Z113 Encounter for screening for infections with a predominantly sexual mode of transmission: Secondary | ICD-10-CM

## 2022-02-21 LAB — POCT URINALYSIS DIP (MANUAL ENTRY)
Bilirubin, UA: NEGATIVE
Blood, UA: NEGATIVE
Glucose, UA: NEGATIVE mg/dL
Ketones, POC UA: NEGATIVE mg/dL
Leukocytes, UA: NEGATIVE
Nitrite, UA: NEGATIVE
Protein Ur, POC: NEGATIVE mg/dL
Spec Grav, UA: 1.025 (ref 1.010–1.025)
Urobilinogen, UA: 0.2 E.U./dL
pH, UA: 7 (ref 5.0–8.0)

## 2022-02-21 LAB — POCT URINE PREGNANCY: Preg Test, Ur: NEGATIVE

## 2022-02-21 NOTE — ED Provider Notes (Signed)
[facility]    CSN: [id number] Arrival date & time: 02/21/22  1053    HISTORY   Chief Complaint  Patient presents with   Abdominal Pain   Nausea   HPI Patricia Shepard is a pleasant, 26 y.o. female who presents to urgent care today. Patient complains of abdominal pain accompanied by nausea for the past 2 days.  Patient is requesting STD testing.  Patient states she is currently breast-feeding her 74-month-old baby.  Patient states has not tried anything to alleviate her symptoms.  The history is provided by the patient.   Past Medical History:  Diagnosis Date   Asthma    Patient Active Problem List   Diagnosis Date Noted   Pap smear abnormality of cervix with ASCUS favoring dysplasia 01/31/2019   Previous cesarean section 04/12/2018   Hx of preeclampsia, prior pregnancy, currently pregnant 04/25/2017   Asthma 03/24/2013   Past Surgical History:  Procedure Laterality Date   CESAREAN SECTION     OB History     Gravida  4   Para  2   Term  2   Preterm  0   AB  1   Living         SAB  0   IAB  1   Ectopic  0   Multiple      Live Births             Home Medications    Prior to Admission medications   Medication Sig Start Date End Date Taking? Authorizing Provider  albuterol (PROVENTIL HFA;VENTOLIN HFA) 108 (90 BASE) MCG/ACT inhaler Inhale 2 puffs into the lungs every 6 (six) hours as needed. For breathing    [provider]  Prenatal Vit-Fe Fumarate-FA (PRENATAL VITAMIN PO) Take 1 tablet by mouth daily. 06/18/18   [provider]    Family History History reviewed. No pertinent family history. Social History Social History   Tobacco Use   Smoking status: Never   Smokeless tobacco: Never  Substance Use Topics   Alcohol use: Never   Drug use: Never   Allergies   Patient has no known allergies.  Review of Systems Review of Systems Pertinent findings revealed after performing a 14 point review of systems has  been noted in the history of present illness.  Physical Exam Triage Vital Signs ED Triage Vitals  Enc Vitals Group     BP 02/15/21 0827 (!) 147/82     Pulse Rate 02/15/21 0827 72     Resp 02/15/21 0827 18     Temp 02/15/21 0827 98.3 F (36.8 C)     Temp Source 02/15/21 0827 Oral     SpO2 02/15/21 0827 98 %     Weight --      Height --      Head Circumference --      Peak Flow --      Pain Score 02/15/21 0826 5     Pain Loc --      Pain Edu? --      Excl. in [place]? --   No data found.  Updated Vital Signs BP 127/81 (BP Location: Right Arm)   Pulse 73   Temp 98.1 F (36.7 C) (Oral)   Resp 16   LMP 01/31/2022   SpO2 98%   Breastfeeding Yes   Physical Exam Vitals and nursing note reviewed.  Constitutional:      General: She is not in acute distress.    Appearance: Normal appearance. She  is not ill-appearing.  HENT:     Head: Normocephalic and atraumatic.  Eyes:     General: Lids are normal.        Right eye: No discharge.        Left eye: No discharge.     Extraocular Movements: Extraocular movements intact.     Conjunctiva/sclera: Conjunctivae normal.     Right eye: Right conjunctiva is not injected.     Left eye: Left conjunctiva is not injected.  Neck:     Trachea: Trachea and phonation normal.  Cardiovascular:     Rate and Rhythm: Normal rate and regular rhythm.     Pulses: Normal pulses.     Heart sounds: Normal heart sounds. No murmur heard.    No friction rub. No gallop.  Pulmonary:     Effort: Pulmonary effort is normal. No accessory muscle usage, prolonged expiration or respiratory distress.     Breath sounds: Normal breath sounds. No stridor, decreased air movement or transmitted upper airway sounds. No decreased breath sounds, wheezing, rhonchi or rales.  Chest:     Chest wall: No tenderness.  Abdominal:     Tenderness: There is no abdominal tenderness.  Genitourinary:    Comments: Patient politely declines pelvic exam today, patient provided a  vaginal swab for testing. Musculoskeletal:        General: Normal range of motion.     Cervical back: Normal range of motion and neck supple. Normal range of motion.  Lymphadenopathy:     Cervical: No cervical adenopathy.  Skin:    General: Skin is warm and dry.     Findings: No erythema or rash.  Neurological:     General: No focal deficit present.     Mental Status: She is alert and oriented to person, place, and time.  Psychiatric:        Mood and Affect: Mood normal.        Behavior: Behavior normal.     Visual Acuity Right Eye Distance:   Left Eye Distance:   Bilateral Distance:    Right Eye Near:   Left Eye Near:    Bilateral Near:     UC Couse / Diagnostics / Procedures:     Radiology No results found.  Procedures Procedures (including critical care time) EKG  Pending results:  Labs Reviewed  RPR  HIV ANTIBODY (ROUTINE TESTING W REFLEX)  POCT URINALYSIS DIP (MANUAL ENTRY)  POCT URINE PREGNANCY  CERVICOVAGINAL ANCILLARY ONLY    Medications Ordered in UC: Medications - No data to display  UC Diagnoses / Final Clinical Impressions(s)   I have reviewed the triage vital signs and the nursing notes.  Pertinent labs & imaging results that were available during my care of the patient were reviewed by me and considered in my medical decision making (see chart for details).    Final diagnoses:  Screening examination for STD (sexually transmitted disease)    STD screening was performed, patient advised that the results be posted to their MyChart and if any of the results are positive, they will be notified by phone, further treatment will be provided as indicated based on results of STD screening. Patient was advised to abstain from sexual intercourse until that they receive the results of their STD testing.  Patient was also advised to use condoms to protect themselves from STD exposure. Urinalysis today was normal. Urine pregnancy test was negative. Return  precautions advised.  Drug allergies reviewed, all questions addressed.  ED Prescriptions   None    PDMP not reviewed this encounter.  Disposition Upon Discharge:  Condition: stable for discharge home  Patient presented with concern for an acute illness with associated systemic symptoms and significant discomfort requiring urgent management. In my opinion, this is a condition that a prudent lay person (someone who possesses an average knowledge of health and medicine) may potentially expect to result in complications if not addressed urgently such as respiratory distress, impairment of bodily function or dysfunction of bodily organs.   As such, the patient has been evaluated and assessed, work-up was performed and treatment was provided in alignment with urgent care protocols and evidence based medicine.  Patient/parent/caregiver has been advised that the patient may require follow up for further testing and/or treatment if the symptoms continue in spite of treatment, as clinically indicated and appropriate.  Routine symptom specific, illness specific and/or disease specific instructions were discussed with the patient and/or caregiver at length.  Prevention strategies for avoiding STD exposure were also discussed.  The patient will follow up with their current PCP if and as advised. If the patient does not currently have a PCP we will assist them in obtaining one.   The patient may need specialty follow up if the symptoms continue, in spite of conservative treatment and management, for further workup, evaluation, consultation and treatment as clinically indicated and appropriate.  Patient/parent/caregiver verbalized understanding and agreement of plan as discussed.  All questions were addressed during visit.  Please see discharge instructions below for further details of plan.  Discharge Instructions:   Discharge Instructions      The results of your vaginal swab test which screens  for BV, yeast, gonorrhea, chlamydia and trichomonas will be made posted to your MyChart account once it is complete.  This typically takes 2 to 4 days.  Please abstain from sexual intercourse of any kind, vaginal, oral or anal, until you have received the results of your STD testing.     If any of your results are abnormal, you will receive a phone call regarding treatment.  Prescriptions, if any are needed, will be provided for you at your pharmacy.     Your urine pregnancy test today is negative.  No further testing is needed because you have not missed a cycle as of yet.  You are welcome to purchase over-the-counter home pregnancy tests and use those for repeat pregnancy testing as needed.   Our point-of-care analysis of your urine sample today was normal and did not reveal any concern for urinary tract infection.   Thank you for visiting urgent care today.  I appreciate the opportunity to participate in your care.        This office note has been dictated using Museum/gallery curator.  Unfortunately, this method of dictation can sometimes lead to typographical or grammatical errors.  I apologize for your inconvenience in advance if this occurs.  Please do not hesitate to reach out to me if clarification is needed.       Lynden Oxford Scales, PA-C 02/21/22 1257

## 2022-02-21 NOTE — ED Triage Notes (Addendum)
The patient c/o lower abd pain and nausea that began 2 day ago.   The patient is requesting to be tested for STDs.    She reports she does breastfeed.

## 2022-02-21 NOTE — Discharge Instructions (Addendum)
The results of your vaginal swab test which screens for BV, yeast, gonorrhea, chlamydia and trichomonas will be made posted to your MyChart account once it is complete.  This typically takes 2 to 4 days.  Please abstain from sexual intercourse of any kind, vaginal, oral or anal, until you have received the results of your STD testing.     If any of your results are abnormal, you will receive a phone call regarding treatment.  Prescriptions, if any are needed, will be provided for you at your pharmacy.     Your urine pregnancy test today is negative.  No further testing is needed because you have not missed a cycle as of yet.  You are welcome to purchase over-the-counter home pregnancy tests and use those for repeat pregnancy testing as needed.   Our point-of-care analysis of your urine sample today was normal and did not reveal any concern for urinary tract infection.   Thank you for visiting urgent care today.  I appreciate the opportunity to participate in your care.

## 2022-02-22 LAB — HIV ANTIBODY (ROUTINE TESTING W REFLEX): HIV Screen 4th Generation wRfx: NONREACTIVE

## 2022-02-22 LAB — RPR: RPR Ser Ql: NONREACTIVE

## 2022-02-24 LAB — CERVICOVAGINAL ANCILLARY ONLY
Bacterial Vaginitis (gardnerella): NEGATIVE
Candida Glabrata: NEGATIVE
Candida Vaginitis: NEGATIVE
Chlamydia: NEGATIVE
Comment: NEGATIVE
Comment: NEGATIVE
Comment: NEGATIVE
Comment: NEGATIVE
Comment: NEGATIVE
Comment: NORMAL
Neisseria Gonorrhea: NEGATIVE
Trichomonas: NEGATIVE

## 2022-03-26 ENCOUNTER — Encounter (HOSPITAL_COMMUNITY): Payer: Self-pay

## 2022-03-26 ENCOUNTER — Other Ambulatory Visit: Payer: Self-pay

## 2022-03-26 ENCOUNTER — Emergency Department (HOSPITAL_COMMUNITY)
Admission: EM | Admit: 2022-03-26 | Discharge: 2022-03-26 | Discharge status: Against Medical Advice | Payer: Medicaid Other | Attending: Emergency Medicine | Admitting: Emergency Medicine

## 2022-03-26 DIAGNOSIS — Z5321 Procedure and treatment not carried out due to patient leaving prior to being seen by health care provider: Secondary | ICD-10-CM | POA: Insufficient documentation

## 2022-03-26 DIAGNOSIS — R109 Unspecified abdominal pain: Secondary | ICD-10-CM | POA: Diagnosis present

## 2022-03-26 NOTE — ED Notes (Signed)
Pt needs to leave for appt.

## 2022-03-26 NOTE — ED Triage Notes (Signed)
Pt arrived POV. C/o lower L and lower abd pain for 2x days. Mild nausea, but no emesis. + preg test 10x days ago.  No px now, but was 6/10 this AM.  AOx4

## 2022-07-10 ENCOUNTER — Ambulatory Visit
Admission: EM | Admit: 2022-07-10 | Discharge: 2022-07-10 | Disposition: A | Discharge status: Home / Self Care | Payer: Medicaid Other | Attending: Urgent Care | Admitting: Urgent Care

## 2022-07-10 DIAGNOSIS — R112 Nausea with vomiting, unspecified: Secondary | ICD-10-CM | POA: Insufficient documentation

## 2022-07-10 LAB — POCT URINE PREGNANCY: Preg Test, Ur: NEGATIVE

## 2022-07-10 NOTE — ED Triage Notes (Signed)
Patient presents to UC for nausea, vomiting x 3 weeks. Concerned with possible pregnancy. Last LMP 03/04 but states she has had a negative preg test. Does not use BC.

## 2022-07-10 NOTE — Discharge Instructions (Signed)
Please schedule a follow-up appointment with your primary care provider to review your symptoms.

## 2022-07-10 NOTE — ED Provider Notes (Signed)
[facility]    CSN: [id number] Arrival date & time: 07/10/22  0855      History   Chief Complaint Chief Complaint  Patient presents with   Possible Pregnancy   SEXUALLY TRANSMITTED DISEASE   Nausea   Emesis    HPI Patricia Shepard is a 27 y.o. female.    Possible Pregnancy  Emesis   Presents to urgent care with nausea and vomiting x 3 weeks.  Expresses concern for possible pregnancy.  Last menstrual period was 06/23/2022 patient does not use any birth control.  Endorses lower abdominal pain associated with her symptoms.  Also endorses constipation chronically.  Past Medical History:  Diagnosis Date   Asthma     Patient Active Problem List   Diagnosis Date Noted   Pap smear abnormality of cervix with ASCUS favoring dysplasia 01/31/2019   Previous cesarean section 04/12/2018   Hx of preeclampsia, prior pregnancy, currently pregnant 04/25/2017   Asthma 03/24/2013    Past Surgical History:  Procedure Laterality Date   CESAREAN SECTION      OB History     Gravida  5   Para  2   Term  2   Preterm  0   AB  1   Living         SAB  0   IAB  1   Ectopic  0   Multiple      Live Births               Home Medications    Prior to Admission medications   Medication Sig Start Date End Date Taking? Authorizing Provider  albuterol (PROVENTIL HFA;VENTOLIN HFA) 108 (90 BASE) MCG/ACT inhaler Inhale 2 puffs into the lungs every 6 (six) hours as needed. For breathing    [provider]  Prenatal Vit-Fe Fumarate-FA (PRENATAL VITAMIN PO) Take 1 tablet by mouth daily. 06/18/18   [provider]    Family History History reviewed. No pertinent family history.  Social History Social History   Tobacco Use   Smoking status: Never   Smokeless tobacco: Never  Substance Use Topics   Alcohol use: Never   Drug use: Never     Allergies   Patient has no known allergies.   Review of Systems Review of Systems   Gastrointestinal:  Positive for vomiting.     Physical Exam Triage Vital Signs ED Triage Vitals  Enc Vitals Group     BP 07/10/22 0913 114/72     Pulse Rate 07/10/22 0913 (!) 59     Resp 07/10/22 0913 18     Temp 07/10/22 0913 97.9 F (36.6 C)     Temp Source 07/10/22 0913 Temporal     SpO2 07/10/22 0913 98 %     Weight --      Height --      Head Circumference --      Peak Flow --      Pain Score 07/10/22 0912 0     Pain Loc --      Pain Edu? --      Excl. in [place]? --    No data found.  Updated Vital Signs BP 114/72 (BP Location: Left Arm)   Pulse (!) 59   Temp 97.9 F (36.6 C) (Temporal)   Resp 18   LMP 06/23/2022 (Approximate)   SpO2 98%   Breastfeeding Unknown   Visual Acuity Right Eye Distance:   Left Eye Distance:   Bilateral Distance:  Right Eye Near:   Left Eye Near:    Bilateral Near:     Physical Exam Vitals reviewed.  Constitutional:      Appearance: Normal appearance.  Abdominal:     General: Abdomen is flat.     Tenderness: There is no abdominal tenderness.  Skin:    General: Skin is warm and dry.  Neurological:     General: No focal deficit present.     Mental Status: She is alert and oriented to person, place, and time.  Psychiatric:        Mood and Affect: Mood normal.        Behavior: Behavior normal.      UC Treatments / Results  Labs (all labs ordered are listed, but only abnormal results are displayed) Labs Reviewed  POCT URINE PREGNANCY    EKG   Radiology No results found.  Procedures Procedures (including critical care time)  Medications Ordered in UC Medications - No data to display  Initial Impression / Assessment and Plan / UC Course  I have reviewed the triage vital signs and the nursing notes.  Pertinent labs & imaging results that were available during my care of the patient were reviewed by me and considered in my medical decision making (see chart for details).   A review of the patient's dietary  habits suggest a diet that is high in fast foods and snack foods, low in fruits and vegetables, low hydration.  The patient states that she suffers from frequent constipation and difficult bowel movements.  I recommended significant increase in her water intake as well as fruits and vegetables to try to relieve her constipation.  This is a good initial first step towards improving her health I feel and may resolve her symptoms of nausea and vomiting if it is related to her constipation.  Also recommended that she follow-up with her primary care provider for additional evaluation if necessary.   Final Clinical Impressions(s) / UC Diagnoses   Final diagnoses:  None   Discharge Instructions   None    ED Prescriptions   None    PDMP not reviewed this encounter.   Rose Phi, [place] 07/10/22 [phone]

## 2022-07-15 LAB — CERVICOVAGINAL ANCILLARY ONLY
Bacterial Vaginitis (gardnerella): POSITIVE — AB
Candida Glabrata: NEGATIVE
Candida Vaginitis: NEGATIVE
Chlamydia: NEGATIVE
Comment: NEGATIVE
Comment: NEGATIVE
Comment: NEGATIVE
Comment: NEGATIVE
Comment: NEGATIVE
Comment: NORMAL
Neisseria Gonorrhea: POSITIVE — AB
Trichomonas: NEGATIVE

## 2022-07-16 ENCOUNTER — Telehealth (HOSPITAL_COMMUNITY): Payer: Self-pay | Admitting: Emergency Medicine

## 2022-07-16 MED ORDER — METRONIDAZOLE 0.75 % VA GEL: 1.0000 | Freq: Every day | VAGINAL | 0 refills | Status: AC

## 2022-07-17 ENCOUNTER — Telehealth (HOSPITAL_COMMUNITY): Payer: Self-pay | Admitting: Emergency Medicine

## 2022-07-17 NOTE — Telephone Encounter (Signed)
Per protocol, patient will need treatment with IM Rocephin 500mg  for positive Gonorrhea. Will also need treatment with Metrogel (breastfeeding) Contacted patient by phone.  Verified identity using two identifiers.  Provided positive result.  Reviewed safe sex practices, notifying partners, and refraining from sexual activities for 7 days from time of treatment.  Patient verified understanding, all questions answered.   [facility]HS notified Verified pharmacy, prescription sent

## 2022-07-22 ENCOUNTER — Ambulatory Visit
Admission: EM | Admit: 2022-07-22 | Discharge: 2022-07-22 | Disposition: A | Discharge status: Home / Self Care | Payer: Medicaid Other | Attending: Urgent Care | Admitting: Urgent Care

## 2022-07-22 DIAGNOSIS — A549 Gonococcal infection, unspecified: Secondary | ICD-10-CM | POA: Diagnosis not present

## 2022-07-22 MED ORDER — CEFTRIAXONE SODIUM 500 MG IJ SOLR
500.0000 mg | Freq: Once | INTRAMUSCULAR | Status: AC
  Administered 2022-07-22: 500 mg via INTRAMUSCULAR

## 2022-07-22 NOTE — ED Notes (Signed)
Tolerated administration of rocephin shot well. No further questions asked by patient. Advised to follow up if needs arise. Safe sex practices reviewed with patient.

## 2022-07-31 ENCOUNTER — Ambulatory Visit
Admission: EM | Admit: 2022-07-31 | Discharge: 2022-07-31 | Disposition: A | Discharge status: Home / Self Care | Payer: Medicaid Other | Attending: Emergency Medicine | Admitting: Emergency Medicine

## 2022-07-31 ENCOUNTER — Encounter: Payer: Self-pay | Admitting: Emergency Medicine

## 2022-07-31 DIAGNOSIS — Z3201 Encounter for pregnancy test, result positive: Secondary | ICD-10-CM | POA: Diagnosis not present

## 2022-07-31 DIAGNOSIS — Z3A01 Less than 8 weeks gestation of pregnancy: Secondary | ICD-10-CM | POA: Diagnosis not present

## 2022-07-31 DIAGNOSIS — R1030 Lower abdominal pain, unspecified: Secondary | ICD-10-CM | POA: Diagnosis not present

## 2022-07-31 LAB — POCT URINE PREGNANCY: Preg Test, Ur: POSITIVE — AB

## 2022-07-31 NOTE — ED Triage Notes (Signed)
Pt is here for pregnancy test only

## 2022-07-31 NOTE — Discharge Instructions (Addendum)
Go to the emergency department for evaluation of your lower abdominal pain and positive pregnancy test.  

## 2022-07-31 NOTE — ED Notes (Signed)
Provider advised patient to go to the ER for further evaluation due to pelvic pain with positive pregnancy test here.

## 2022-07-31 NOTE — ED Provider Notes (Signed)
Patricia Shepard    CSN: [id number] Arrival date & time: 07/31/22  0909      History   Chief Complaint Chief Complaint  Patient presents with   Possible Pregnancy    HPI Patricia Shepard is a 27 y.o. female.  Patient presents with request for pregnancy test.  LMP 06/23/2022.  Patient is breast-feeding and states she does not think she could be pregnant because she is breast-feeding.  However, since she is late on her cycle, she requests pregnancy test.  She also reports lower abdominal cramping this morning; none currently.  Patient denies fever, chills, shortness of breath, chest pain, nausea, vomiting, diarrhea, dysuria, vaginal discharge, vaginal bleeding, or other symptoms.  Her medical history includes asthma.     The history is provided by the patient and medical records.    Past Medical History:  Diagnosis Date   Asthma     Patient Active Problem List   Diagnosis Date Noted   Pap smear abnormality of cervix with ASCUS favoring dysplasia 01/31/2019   Previous cesarean section 04/12/2018   Hx of preeclampsia, prior pregnancy, currently pregnant 04/25/2017   Asthma 03/24/2013    Past Surgical History:  Procedure Laterality Date   CESAREAN SECTION      OB History     Gravida  5   Para  2   Term  2   Preterm  0   AB  1   Living         SAB  0   IAB  1   Ectopic  0   Multiple      Live Births               Home Medications    Prior to Admission medications   Medication Sig Start Date End Date Taking? Authorizing Provider  albuterol (PROVENTIL HFA;VENTOLIN HFA) 108 (90 BASE) MCG/ACT inhaler Inhale 2 puffs into the lungs every 6 (six) hours as needed. For breathing    [provider]  Prenatal Vit-Fe Fumarate-FA (PRENATAL VITAMIN PO) Take 1 tablet by mouth daily. 06/18/18   [provider]    Family History History reviewed. No pertinent family history.  Social History Social History   Tobacco Use   Smoking  status: Never   Smokeless tobacco: Never  Substance Use Topics   Alcohol use: Never   Drug use: Never     Allergies   Patient has no known allergies.   Review of Systems Review of Systems  Constitutional:  Negative for chills and fever.  Respiratory:  Negative for cough and shortness of breath.   Cardiovascular:  Negative for chest pain and palpitations.  Gastrointestinal:  Positive for abdominal pain. Negative for constipation, diarrhea, nausea and vomiting.  Genitourinary:  Negative for dysuria, flank pain, frequency, hematuria, pelvic pain, vaginal bleeding and vaginal discharge.  All other systems reviewed and are negative.    Physical Exam Triage Vital Signs ED Triage Vitals  Enc Vitals Group     BP 07/31/22 0916 116/79     Pulse Rate 07/31/22 0916 76     Resp 07/31/22 0916 16     Temp 07/31/22 0916 97.8 F (36.6 C)     Temp Source 07/31/22 0916 Oral     SpO2 07/31/22 0916 97 %     Weight --      Height --      Head Circumference --      Peak Flow --      Pain Score 07/31/22  0911 0     Pain Loc --      Pain Edu? --      Excl. in [place]? --    No data found.  Updated Vital Signs BP 116/79 (BP Location: Right Arm)   Pulse 76   Temp 97.8 F (36.6 C) (Oral)   Resp 16   LMP 06/23/2022 (Approximate)   SpO2 97%   Breastfeeding Yes   Visual Acuity Right Eye Distance:   Left Eye Distance:   Bilateral Distance:    Right Eye Near:   Left Eye Near:    Bilateral Near:     Physical Exam Vitals and nursing note reviewed.  Constitutional:      General: She is not in acute distress.    Appearance: She is well-developed. She is not ill-appearing.  HENT:     Mouth/Throat:     Mouth: Mucous membranes are moist.  Cardiovascular:     Rate and Rhythm: Normal rate and regular rhythm.     Heart sounds: Normal heart sounds.  Pulmonary:     Effort: Pulmonary effort is normal. No respiratory distress.     Breath sounds: Normal breath sounds.  Abdominal:      General: Bowel sounds are normal.     Palpations: Abdomen is soft.     Tenderness: There is abdominal tenderness in the right lower quadrant, suprapubic area and left lower quadrant. There is no guarding or rebound.     Comments: Mild tenderness to palpation of lower abdomen.  No rebound or guarding.    Musculoskeletal:     Cervical back: Neck supple.  Skin:    General: Skin is warm and dry.  Neurological:     Mental Status: She is alert.  Psychiatric:        Mood and Affect: Mood normal.        Behavior: Behavior normal.      UC Treatments / Results  Labs (all labs ordered are listed, but only abnormal results are displayed) Labs Reviewed  POCT URINE PREGNANCY - Abnormal; Notable for the following components:      Result Value   Preg Test, Ur Positive (*)    All other components within normal limits    EKG   Radiology No results found.  Procedures Procedures (including critical care time)  Medications Ordered in UC Medications - No data to display  Initial Impression / Assessment and Plan / UC Course  I have reviewed the triage vital signs and the nursing notes.  Pertinent labs & imaging results that were available during my care of the patient were reviewed by me and considered in my medical decision making (see chart for details).    Positive pregnancy test, less than [redacted] weeks gestation pregnancy, lower abdominal pain.  Afebrile and vital signs are stable.  Sending patient to the ED for evaluation of her abdominal pain in the setting of early pregnancy.  She is agreeable to this.  She drove herself here and will drive herself to the ED.  Final Clinical Impressions(s) / UC Diagnoses   Final diagnoses:  Positive pregnancy test  Less than [redacted] weeks gestation of pregnancy  Lower abdominal pain     Discharge Instructions      Go to the emergency department for evaluation of your lower abdominal pain and positive pregnancy test.     ED Prescriptions   None     PDMP not reviewed this encounter.   Mickie Bail, NP 07/31/22 [phone]

## 2022-11-03 ENCOUNTER — Encounter: Payer: Self-pay | Admitting: Emergency Medicine

## 2022-11-03 ENCOUNTER — Ambulatory Visit
Admission: EM | Admit: 2022-11-03 | Discharge: 2022-11-03 | Disposition: A | Discharge status: Home / Self Care | Payer: Medicaid Other | Attending: Internal Medicine | Admitting: Internal Medicine

## 2022-11-03 DIAGNOSIS — A549 Gonococcal infection, unspecified: Secondary | ICD-10-CM

## 2022-11-03 MED ORDER — CEFTRIAXONE SODIUM 500 MG IJ SOLR
500.0000 mg | INTRAMUSCULAR | Status: DC
  Administered 2022-11-03: 500 mg via INTRAMUSCULAR

## 2022-11-03 NOTE — Discharge Instructions (Addendum)
If you develop any abdominal pain, cramps or bleeding please go to the ER.

## 2022-11-03 NOTE — ED Provider Notes (Signed)
[facility]    CSN: [id number] Arrival date & time: 11/03/22  0950      History   Chief Complaint Chief Complaint  Patient presents with   SEXUALLY TRANSMITTED DISEASE    HPI Patricia Shepard is a 27 y.o. female who presents requesting to have GC treatment which she was diagnosed 6/13 and has not had tome to get the shot. She is [redacted] weeks pregnant and has OB appt. In 4 days. She was also positive for Chlamydia and already took Azithromycin 1000 mg PO. Her sexual partner has already been treated and they have not had sex. She does have vaginal discharge, but does not know how much is the pregnancy discharge vs GC.     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 Authorizing Provider  albuterol (PROVENTIL HFA;VENTOLIN HFA) 108 (90 BASE) MCG/ACT inhaler Inhale 2 puffs into the lungs every 6 (six) hours as needed. For breathing    [provider]  Prenatal Vit-Fe Fumarate-FA (PRENATAL VITAMIN PO) Take 1 tablet by mouth daily. 06/18/18   [provider]    Family History History reviewed. No pertinent family history.  Social History Social History   Tobacco Use   Smoking status: Never   Smokeless tobacco: Never  Substance Use Topics   Alcohol use: Never   Drug use: Never     Allergies   Patient has no known  allergies.   Review of Systems Review of Systems   Physical Exam Triage Vital Signs ED Triage Vitals  Encounter Vitals Group     BP 11/03/22 0957 105/69     Systolic BP Percentile --      Diastolic BP Percentile --      Pulse Rate 11/03/22 0957 82     Resp 11/03/22 0957 16     Temp 11/03/22 0957 98.8 F (37.1 C)     Temp Source 11/03/22 0957 Oral     SpO2 11/03/22 0957 97 %     Weight --      Height --      Head Circumference --      Peak Flow --      Pain Score 11/03/22 1004 0     Pain Loc --      Pain Education --      Exclude from Growth Chart --    No data found.  Updated Vital Signs BP 105/69 (BP Location: Left Arm)   Pulse 82   Temp 98.8 F (37.1 C) (Oral)   Resp 16   LMP 06/23/2022 (Approximate)  SpO2 97%   Visual Acuity Right Eye Distance:   Left Eye Distance:   Bilateral Distance:    Right Eye Near:   Left Eye Near:    Bilateral Near:     Physical Exam   UC Treatments / Results  Labs (all labs ordered are listed, but only abnormal results are displayed) Labs Reviewed - No data to display  EKG   Radiology No results found.  Procedures Procedures (including critical care time)  Medications Ordered in UC Medications  cefTRIAXone (ROCEPHIN) injection 500 mg (has no administration in time range)    Initial Impression / Assessment and Plan / UC Course  I have reviewed the triage vital signs and the nursing notes. GC Pregnancy  I read her lab report from 6/13 which shows + for Chlamydia and GC.  She was given Rocephin 500 mg IM here She will FU with OB as scheduled this Friday.  Final Clinical Impressions(s) / UC Diagnoses   Final diagnoses:  Gonorrhea     Discharge Instructions      If you develop any abdominal pain, cramps or bleeding please go to the ER.      ED Prescriptions   None    PDMP not reviewed this encounter.   Garey Ham, PA-C 11/03/22 1015

## 2022-11-03 NOTE — ED Triage Notes (Addendum)
Patient states she tested positive for both chlamydia and gonorrhea on 6/13. Reports that she already taken azithromycin, states the dosage was 2 pills each 500 mg a piece taken in one dose. Patient states she took those closer to the time of diagnosis, but has been working too much and not had enough time to come get her injection sooner. Denies any abdominal pain, back pain, fever, dysuria. Reports being otherwise asymptomatic and only here following the routine testing with incidentally positive findings.  Visualized photo of positive test results
